# Patient Record
Sex: Female | Born: 1950 | Race: White | Hispanic: No | Marital: Single | Smoking: Never smoker
Health system: Southern US, Community
[De-identification: ages and names within clinical notes are randomized; demographics above are authoritative.]

## PROBLEM LIST (undated history)

## (undated) DIAGNOSIS — I2699 Other pulmonary embolism without acute cor pulmonale: Secondary | ICD-10-CM

## (undated) DIAGNOSIS — I10 Essential (primary) hypertension: Secondary | ICD-10-CM

## (undated) DIAGNOSIS — K589 Irritable bowel syndrome without diarrhea: Secondary | ICD-10-CM

## (undated) HISTORY — PX: APPENDECTOMY: SHX54

---

## 2009-05-10 ENCOUNTER — Ambulatory Visit: Payer: Self-pay | Admitting: Internal Medicine

## 2009-05-13 ENCOUNTER — Inpatient Hospital Stay: Payer: Self-pay | Admitting: *Deleted

## 2009-05-13 ENCOUNTER — Ambulatory Visit: Payer: Self-pay | Admitting: Internal Medicine

## 2009-05-29 ENCOUNTER — Ambulatory Visit: Payer: Self-pay | Admitting: Specialist

## 2009-09-15 ENCOUNTER — Ambulatory Visit: Payer: Self-pay | Admitting: Internal Medicine

## 2009-09-17 ENCOUNTER — Ambulatory Visit: Payer: Self-pay | Admitting: Internal Medicine

## 2010-08-11 IMAGING — XA IR VASCULAR PROCEDURE
5 series · 11 of 11 positions shown · IV contrast (IODINE)
Comparison: none

[Series 1: ivc · 2 of 2 slices shown (1 of 4)]
[im 1/2]
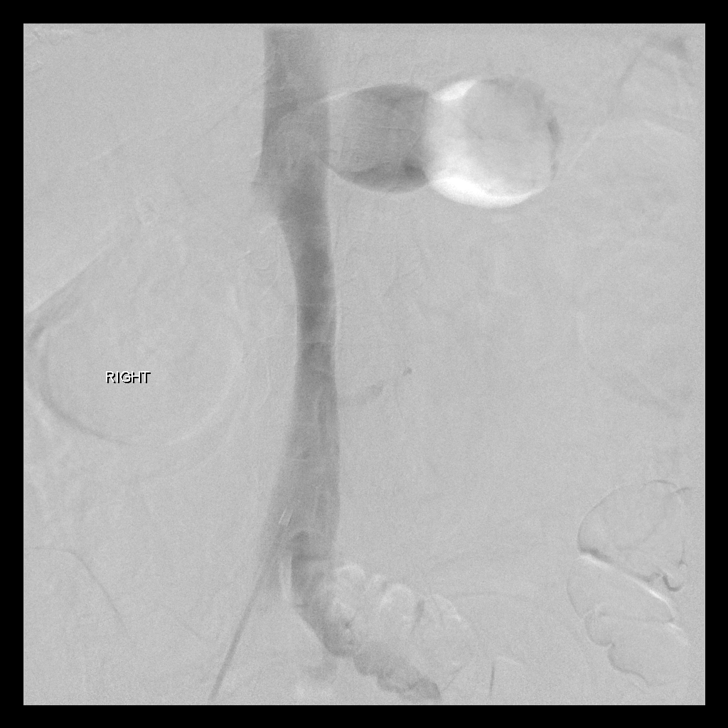
[im 2/2]
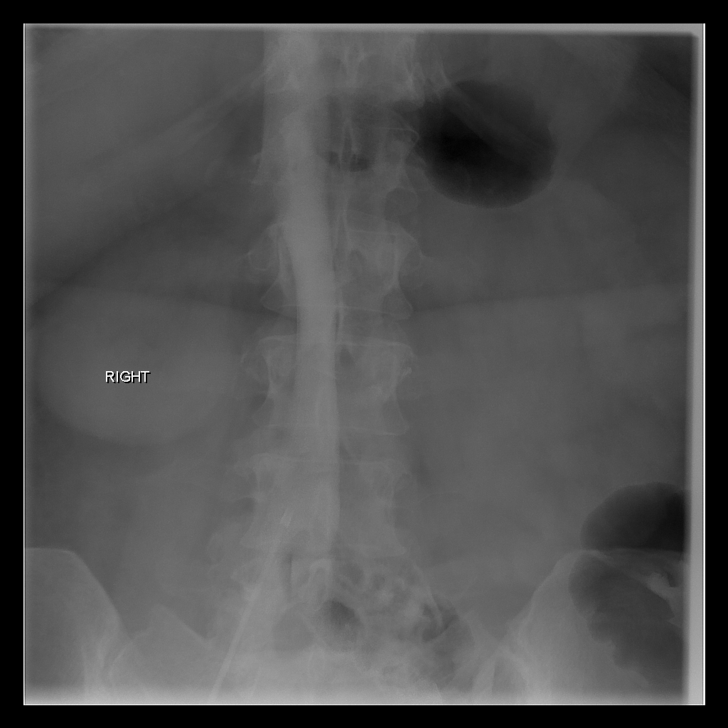

[Series 2: ivc · 3 of 3 slices shown (2 of 4)]
[im 1/3]
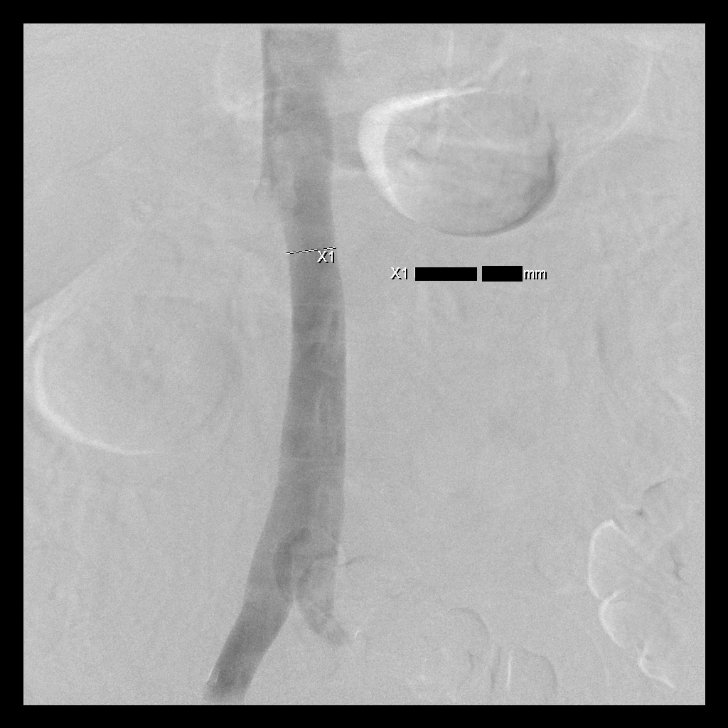
[im 2/3]
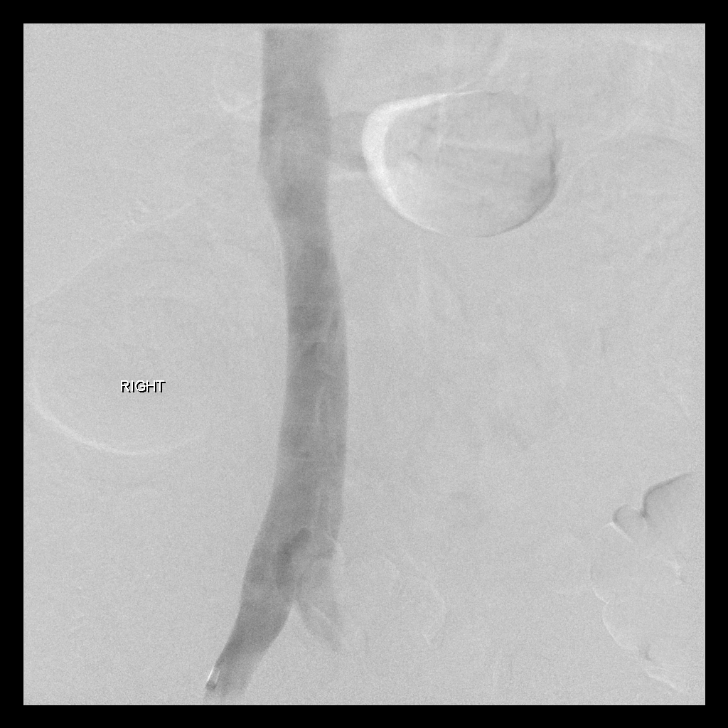
[im 3/3]
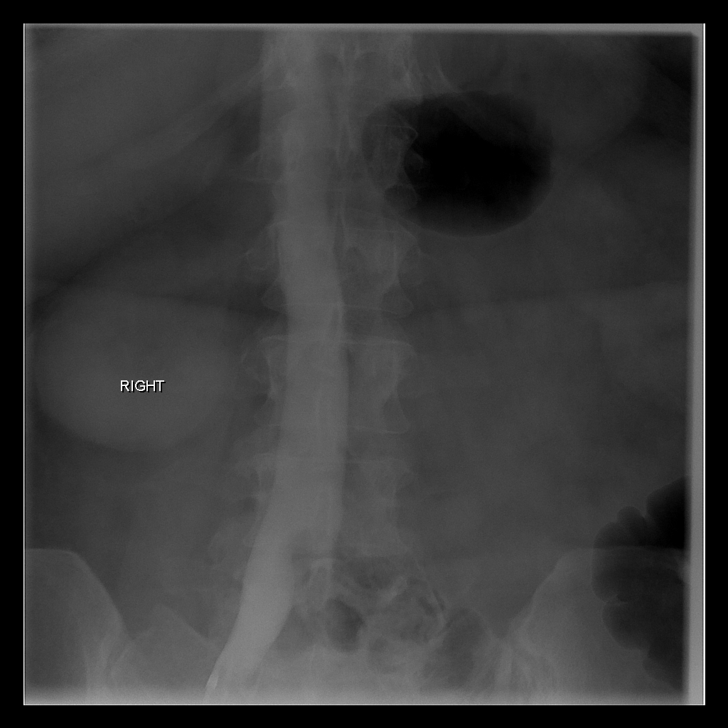

[Series 3: ivc · 3 of 3 slices shown (3 of 4)]
[im 1/3]
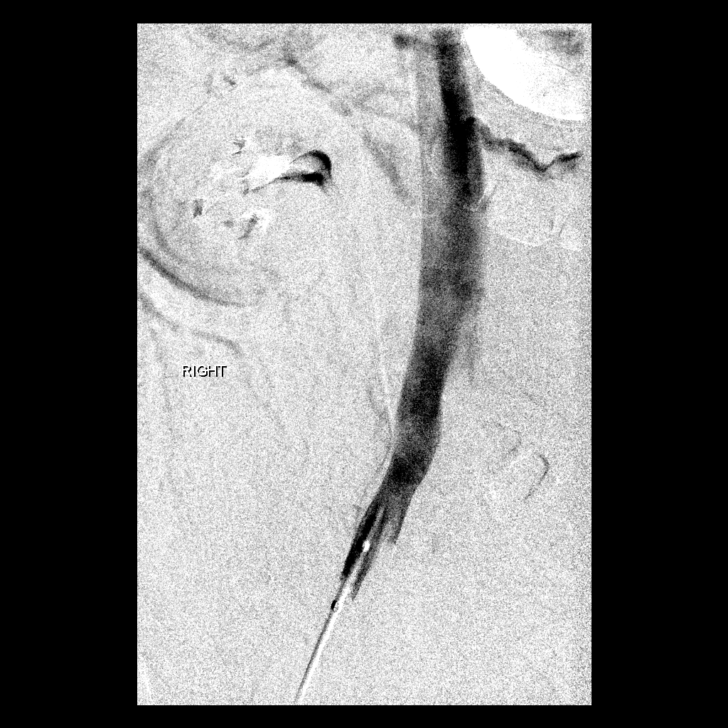
[im 2/3]
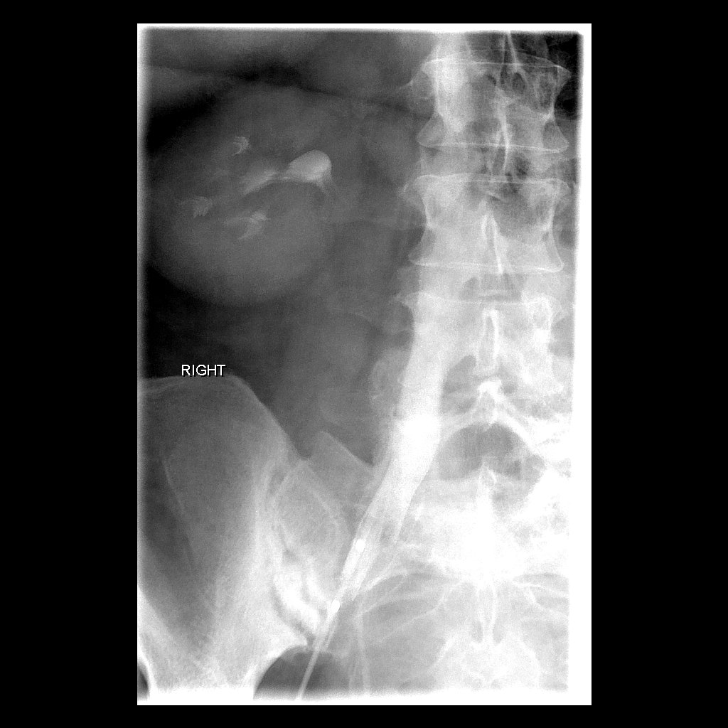
[im 3/3]
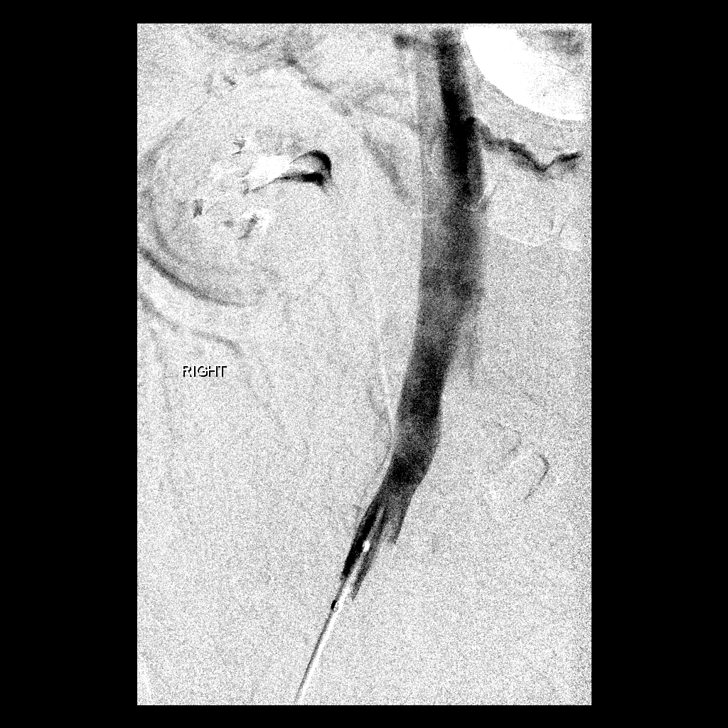

[Series 5: ivc · 2 of 2 slices shown (4 of 4)]
[im 1/2]
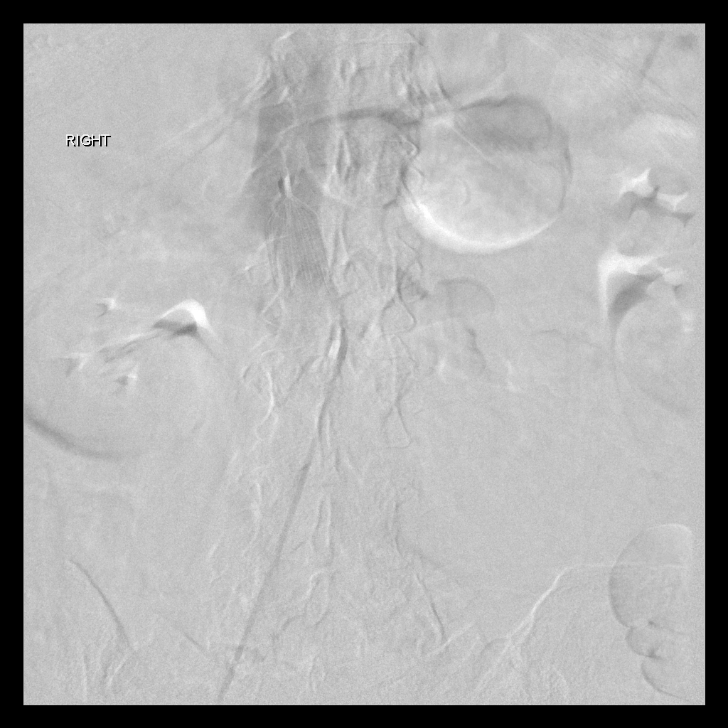
[im 2/2]
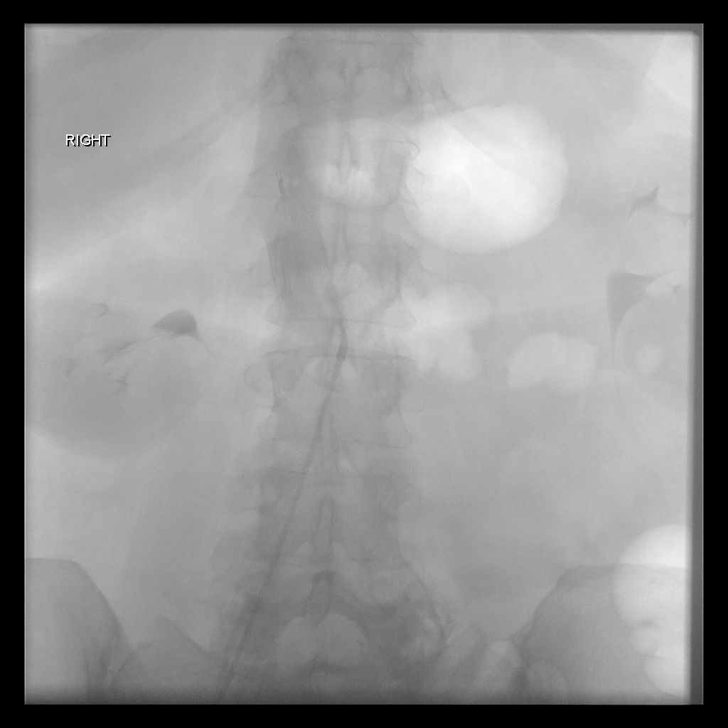

[Series 6: single · 1 of 1 slices shown]
[im 1/1]
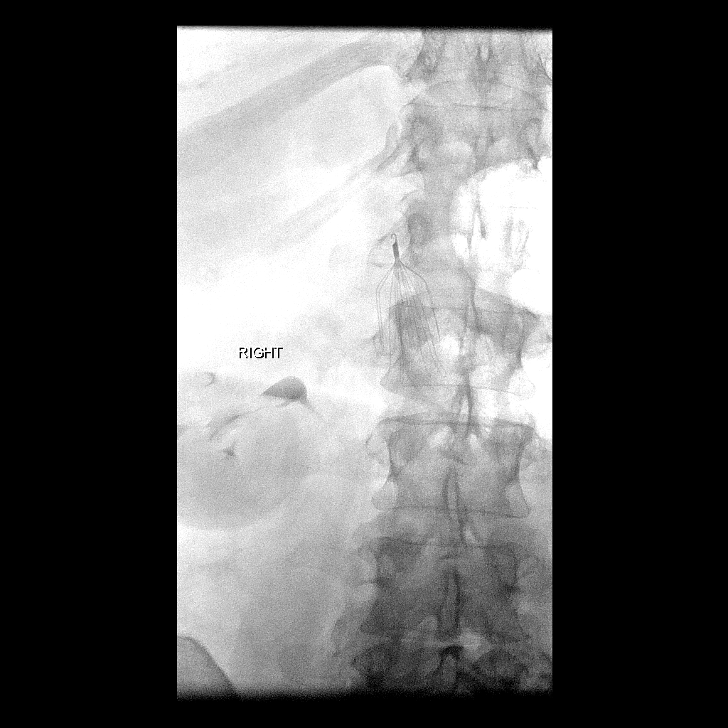

[11 of 11 positions shown; findings below may reference images not displayed]

IMAGES IMPORTED FROM THE SYNGO WORKFLOW SYSTEM
NO DICTATION FOR STUDY

## 2013-06-25 ENCOUNTER — Ambulatory Visit: Payer: Self-pay

## 2013-06-25 LAB — RAPID STREP-A WITH REFLX: Micro Text Report: NEGATIVE

## 2013-06-28 LAB — BETA STREP CULTURE(ARMC)

## 2014-02-02 ENCOUNTER — Ambulatory Visit: Payer: Self-pay | Admitting: Physician Assistant

## 2014-02-02 LAB — RAPID INFLUENZA A&B ANTIGENS (ARMC ONLY)

## 2014-12-09 ENCOUNTER — Ambulatory Visit: Payer: Self-pay | Admitting: Family Medicine

## 2015-09-16 ENCOUNTER — Ambulatory Visit
Admission: EM | Admit: 2015-09-16 | Discharge: 2015-09-16 | Disposition: A | Discharge status: Home / Self Care | Payer: Medicare Other | Attending: Family Medicine | Admitting: Family Medicine

## 2015-09-16 DIAGNOSIS — R1033 Periumbilical pain: Secondary | ICD-10-CM

## 2015-09-16 DIAGNOSIS — R112 Nausea with vomiting, unspecified: Secondary | ICD-10-CM | POA: Diagnosis not present

## 2015-09-16 HISTORY — DX: Irritable bowel syndrome without diarrhea: K58.9

## 2015-09-16 HISTORY — DX: Other pulmonary embolism without acute cor pulmonale: I26.99

## 2015-09-16 HISTORY — DX: Essential (primary) hypertension: I10

## 2015-09-16 MED ORDER — PROMETHAZINE HCL 25 MG/ML IJ SOLN
25.0000 mg | Freq: Once | INTRAMUSCULAR | Status: AC
  Administered 2015-09-16: 25 mg via INTRAMUSCULAR

## 2015-09-16 NOTE — ED Provider Notes (Signed)
CSN: [id number]     Arrival date & time 09/16/15  1039 History   None   Nurses notes were reviewed. Chief Complaint  Patient presents with  . Abdominal Pain  . Emesis   Patient is a 64 year old white female who's had recurrent nausea and abdominal pain since August. She states that since August she's had 3 or 4 episodes which have some nausea down pain but in no past. Her PCP thought this may be secondary to constipation and is placed on MiraLAX which she finally stopped last week It was give her a running stool. She states that the abdominal pain woke her up about 7:00 this morning and she's had no abdominal pain distention of her abdomen and vomiting as well. She stone up 5-6 times according to her sister. She has a history of colonic polyps and gets colonoscoped every 5 years and reports being due for her next colonoscopy. She states that her mother died of metastasis to the brain be thought that originated from the GI tract. She states that she feels that she can only have a good bowel movement she would feel much better.  (Consider location/radiation/quality/duration/timing/severity/associated sxs/prior Treatment) Patient is a 64 y.o. female presenting with abdominal pain and vomiting. The history is provided by the patient and a relative. No language interpreter was used.  Abdominal Pain Pain location:  Generalized Pain quality: bloating, cramping, pressure, sharp and shooting   Pain radiates to:  Does not radiate Pain severity:  Moderate Onset quality:  Sudden Duration:  4 hours Timing:  Constant Progression:  Worsening Chronicity:  New Context: awakening from sleep, laxative use, previous surgery and retching   Context: not medication withdrawal, not sick contacts and not suspicious food intake   Relieved by:  Nothing Ineffective treatments:  None tried Associated symptoms: vomiting   Associated symptoms: no chest pain and no chills   Risk factors: alcohol abuse and obesity     Risk factors: no aspirin use, not elderly, has not had multiple surgeries, no NSAID use and not pregnant   Emesis Associated symptoms: abdominal pain   Associated symptoms: no chills     Past Medical History  Diagnosis Date  . Hypertension   . PE (pulmonary embolism)   . IBS (irritable bowel syndrome)    Past Surgical History  Procedure Laterality Date  . Appendectomy     Family History  Problem Relation Age of Onset  . Cancer Mother   . Pulmonary fibrosis Father    Social History  Substance Use Topics  . Smoking status: Never Smoker   . Smokeless tobacco: None  . Alcohol Use: Yes     Comment: very rarely   OB History    No data available     Review of Systems  Constitutional: Negative for chills.  Cardiovascular: Negative for chest pain.  Gastrointestinal: Positive for vomiting and abdominal pain.  All other systems reviewed and are negative.   Allergies  Codeine  Home Medications   Prior to Admission medications   Medication Sig Start Date End Date Taking? Authorizing Provider  cetirizine (ZYRTEC) 10 MG tablet Take 10 mg by mouth daily.   Yes Historical Provider, MD  fluticasone (FLONASE) 50 MCG/ACT nasal spray Place into both nostrils daily.   Yes Historical Provider, MD  losartan (COZAAR) 50 MG tablet Take 50 mg by mouth daily.   Yes Historical Provider, MD  simethicone (MYLICON) 125 MG chewable tablet Chew 125 mg by mouth every 6 (six) hours as needed for  flatulence.   Yes Historical Provider, MD   Meds Ordered and Administered this Visit   Medications  promethazine (PHENERGAN) injection 25 mg (25 mg Intramuscular Given 09/16/15 1134)    BP 145/56 mmHg  Pulse 56  Temp(Src) 97.2 F (36.2 C) (Tympanic)  Resp 22  Ht  (1.651 m)  Wt 285 lb (129.275 kg)  BMI 47.43 kg/m2  SpO2 97% No data found.   Physical Exam  Constitutional: She is oriented to person, place, and time. She appears well-developed and well-nourished.  HENT:  Head:  Normocephalic and atraumatic.  Right Ear: External ear normal.  Left Ear: External ear normal.  Eyes: Conjunctivae are normal. Pupils are equal, round, and reactive to light.  Neck: Normal range of motion.  Cardiovascular: Normal rate and regular rhythm.   Pulmonary/Chest: Effort normal and breath sounds normal. No respiratory distress. She has no wheezes.  Abdominal: She exhibits distension. Bowel sounds are decreased. There is tenderness in the periumbilical area. There is no CVA tenderness. No hernia.    Musculoskeletal: Normal range of motion. She exhibits no edema.  Neurological: She is alert and oriented to person, place, and time.  Skin: Skin is warm and dry.  Psychiatric: She has a normal mood and affect.  Vitals reviewed.   ED Course  Procedures (including critical care time)  Labs Review Labs Reviewed - No data to display  Imaging Review No results found.   Visual Acuity Review  Right Eye Distance:   Left Eye Distance:   Bilateral Distance:    Right Eye Near:   Left Eye Near:    Bilateral Near:         MDM   1. Periumbilical abdominal pain   2. Non-intractable vomiting with nausea, vomiting of unspecified type    Due to the rigidity of her stomach, decreased bowel sounds, persistent nausea and vomiting and general malaise for the patient probably will benefit from getting fluids lab work and CT of the abdomen which we cannot provide here. Her sister was take her to the closest hospital which would be you see [place] and will give her a 25 mg IM Phenergan to help with the nausea.  Hassan Rowan, MD 09/16/15 [phone]

## 2015-09-16 NOTE — ED Notes (Signed)
Dr. Thurmond ButtsWade examined patient. Pain 10/10 and remains nauseated. [facility] with sister to drive

## 2015-09-16 NOTE — Discharge Instructions (Signed)
Nausea and Vomiting Nausea means you feel sick to your stomach. Throwing up (vomiting) is a reflex where stomach contents come out of your mouth. HOME CARE   Take medicine as told by your doctor.  Do not force yourself to eat. However, you do need to drink fluids.  If you feel like eating, eat a normal diet as told by your doctor.  Eat rice, wheat, potatoes, bread, lean meats, yogurt, fruits, and vegetables.  Avoid high-fat foods.  Drink enough fluids to keep your pee (urine) clear or pale yellow.  Ask your doctor how to replace body fluid losses (rehydrate). Signs of body fluid loss (dehydration) include:  Feeling very thirsty.  Dry lips and mouth.  Feeling dizzy.  Dark pee.  Peeing less than normal.  Feeling confused.  Fast breathing or heart rate. GET HELP RIGHT AWAY IF:   You have blood in your throw up.  You have black or bloody poop (stool).  You have a bad headache or stiff neck.  You feel confused.  You have bad belly (abdominal) pain.  You have chest pain or trouble breathing.  You do not pee at least once every 8 hours.  You have cold, clammy skin.  You keep throwing up after 24 to 48 hours.  You have a fever. MAKE SURE YOU:   Understand these instructions.  Will watch your condition.  Will get help right away if you are not doing well or get worse.   This information is not intended to replace advice given to you by your health care provider. Make sure you discuss any questions you have with your health care provider.   Document Released: 03/28/2008 Document Revised: 01/02/2012 Document Reviewed: 03/11/2011 Elsevier Interactive Patient Education 2016 Elsevier Inc.  Abdominal Pain, Adult Many things can cause belly (abdominal) pain. Most times, the belly pain is not dangerous. Many cases of belly pain can be watched and treated at home. HOME CARE   Do not take medicines that help you go poop (laxatives) unless told to by your  doctor.  Only take medicine as told by your doctor.  Eat or drink as told by your doctor. Your doctor will tell you if you should be on a special diet. GET HELP IF:  You do not know what is causing your belly pain.  You have belly pain while you are sick to your stomach (nauseous) or have runny poop (diarrhea).  You have pain while you pee or poop.  Your belly pain wakes you up at night.  You have belly pain that gets worse or better when you eat.  You have belly pain that gets worse when you eat fatty foods.  You have a fever. GET HELP RIGHT AWAY IF:   The pain does not go away within 2 hours.  You keep throwing up (vomiting).  The pain changes and is only in the right or left part of the belly.  You have bloody or tarry looking poop. MAKE SURE YOU:   Understand these instructions.  Will watch your condition.  Will get help right away if you are not doing well or get worse.   This information is not intended to replace advice given to you by your health care provider. Make sure you discuss any questions you have with your health care provider.   Document Released: 03/28/2008 Document Revised: 10/31/2014 Document Reviewed: 06/19/2013 Elsevier Interactive Patient Education [employer] Elsevier Inc.

## 2015-09-16 NOTE — ED Notes (Signed)
Woke this am with generalized abdominal pain. States abdomen is "very hard and bloated". Started vomiting around 9:30am  Clear emesis. Color pale, skin warm and dry

## 2016-04-15 ENCOUNTER — Emergency Department
Admission: EM | Admit: 2016-04-15 | Discharge: 2016-04-15 | Disposition: A | Discharge status: Home / Self Care | Payer: Medicare Other | Attending: Emergency Medicine | Admitting: Emergency Medicine

## 2016-04-15 ENCOUNTER — Encounter: Payer: Self-pay | Admitting: Emergency Medicine

## 2016-04-15 DIAGNOSIS — Z8679 Personal history of other diseases of the circulatory system: Secondary | ICD-10-CM | POA: Diagnosis not present

## 2016-04-15 DIAGNOSIS — K5641 Fecal impaction: Secondary | ICD-10-CM | POA: Insufficient documentation

## 2016-04-15 DIAGNOSIS — K5900 Constipation, unspecified: Secondary | ICD-10-CM | POA: Diagnosis present

## 2016-04-15 DIAGNOSIS — I10 Essential (primary) hypertension: Secondary | ICD-10-CM | POA: Diagnosis not present

## 2016-04-15 NOTE — ED Provider Notes (Signed)
[facility] Emergency Department Provider Note   ____________________________________________  Time seen: I have reviewed the triage vital signs and the triage nursing note.  HISTORY  Chief Complaint Constipation   Historian Patient  HPI Paige Baker is a 65 y.o. female with a history of irritable bowel syndrome, and gallstones as well as umbilical hernia, who is here for constipation and feeling of rectal impaction. Patient states that she has a colostomy in the past and showed polyps but no other inflammatory bowel disease. She is to have 4 loose bowel movements per day until last September when she started having constipation instead. For the past few days she's had rectal pain and unable to pass stool. Her last vomiting was on Monday. She has been taking Colace, fiber, and then she also tried MiraLAX as well as an enema last night. Pain is moderate.  She actually [facility] and gallbladder removal. She would really like to resolve the fecal impaction 2 get her surgery today rather than reschedule.    Past Medical History  Diagnosis Date  . Hypertension   . PE (pulmonary embolism)   . IBS (irritable bowel syndrome)     There are no active problems to display for this patient.   Past Surgical History  Procedure Laterality Date  . Appendectomy      Current Outpatient Rx  Name  Route  Sig  Dispense  Refill  . cetirizine (ZYRTEC) 10 MG tablet   Oral   Take 10 mg by mouth daily.         . fluticasone (FLONASE) 50 MCG/ACT nasal spray   Each Nare   Place into both nostrils daily.         Marland Kitchen. losartan (COZAAR) 50 MG tablet   Oral   Take 50 mg by mouth daily.         . simethicone (MYLICON) 125 MG chewable tablet   Oral   Chew 125 mg by mouth every 6 (six) hours as needed for flatulence.           Allergies Sulfamethoxazole-trimethoprim and Codeine  Family History   Problem Relation Age of Onset  . Cancer Mother   . Pulmonary fibrosis Father     Social History Social History  Substance Use Topics  . Smoking status: Never Smoker   . Smokeless tobacco: None  . Alcohol Use: Yes     Comment: very rarely    Review of Systems  Constitutional: Negative for fever. Eyes: Negative for visual changes. ENT: Negative for sore throat. Cardiovascular: Negative for chest pain. Respiratory: Negative for shortness of breath. Gastrointestinal: Negative for Vomiting. Genitourinary: Negative for dysuria. Musculoskeletal: Negative for back pain. Skin: Negative for rash. Neurological: Negative for headache. 10 point Review of Systems otherwise negative ____________________________________________   PHYSICAL EXAM:  VITAL SIGNS: ED Triage Vitals  Enc Vitals Group     BP 04/15/16 0626 160/84 mmHg     Pulse Rate 04/15/16 0626 77     Resp 04/15/16 0626 20     Temp 04/15/16 0626 97.8 F (36.6 C)     Temp Source 04/15/16 0626 Oral     SpO2 04/15/16 0626 98 %     Weight 04/15/16 0626 275 lb (124.739 kg)     Height 04/15/16 0626 5\' 5"  (1.651 m)     Head Cir --      Peak Flow --      Pain Score  04/15/16 0625 5     Pain Loc --      Pain Edu? --      Excl. in GC? --      Constitutional: Alert and oriented. Standing up pacing around feels uncomfortable especially when sitting.Marland Kitchen. HEENT   Head: Normocephalic and atraumatic.      Eyes: Conjunctivae are normal. PERRL. Normal extraocular movements.      Ears:         Nose: No congestion/rhinnorhea.   Mouth/Throat: Mucous membranes are moist.   Neck: No stridor. Cardiovascular/Chest: Normal rate, regular rhythm.  No murmurs, rubs, or gallops. Respiratory: Normal respiratory effort without tachypnea nor retractions. Breath sounds are clear and equal bilaterally. No wheezes/rales/rhonchi. Gastrointestinal: Soft. No distention, no guarding, no rebound. Nontender.  Obese  Genitourinary/rectal:  Large  amount of moderately firm stool in the rectum. Musculoskeletal: Nontender with normal range of motion in all extremities. No joint effusions.  No lower extremity tenderness.  No edema. Neurologic:  Normal speech and language. No gross or focal neurologic deficits are appreciated. Skin:  Skin is warm, dry and intact. No rash noted. Psychiatric: Mood and affect are normal. Speech and behavior are normal. Patient exhibits appropriate insight and judgment.  ____________________________________________   EKG I, Governor Rooksebecca Winford Hehn, MD, the attending physician have personally viewed and interpreted all ECGs.  None ____________________________________________  LABS (pertinent positives/negatives)  Labs Reviewed - No data to display  ____________________________________________  RADIOLOGY All Xrays were viewed by me. Imaging interpreted by Radiologist.  None __________________________________________  PROCEDURES  Procedure(s) performed: Manual fecal disimpaction. Performed by myself, Dr. Shaune PollackLord M.D. Moderate amount of hard brown stool was removed manually. No complications.  Critical Care performed: None  ____________________________________________   ED COURSE / ASSESSMENT AND PLAN  Pertinent labs & imaging results that were available during my care of the patient were reviewed by me and considered in my medical decision making (see chart for details).   Patient confirmed to have hard stool in the rectum. I did disimpact her once and then she tried and passed a small amount of stool. I disimpacted her a second time and after that she did have a large bowel movement and felt relieved.  She'll be discharged, she [facility].    CONSULTATIONS:   None   Patient / Family / Caregiver informed of clinical course, medical decision-making process, and agree with plan.   I discussed return precautions, follow-up instructions, and  discharged instructions with patient and/or family.   ___________________________________________   FINAL CLINICAL IMPRESSION(S) / ED DIAGNOSES   Final diagnoses:  Fecal impaction in rectum [facility](HCC)              Note: This dictation was prepared with Dragon dictation. Any transcriptional errors that result from this process are unintentional   Governor Rooksebecca Bevely Hackbart, MD 04/15/16 [id number]

## 2016-04-15 NOTE — ED Notes (Signed)
Pt c/o of constipation beginning 6/19 and nausea. Pt reports she is scheduled for umbilical [facility]. Pt reports she took Miramax on Tuesday and Wednesday, and has been taking 3 colace/day, and gummy fiber vitamins. Pt reports hx of IBS. Pt reports she still has diarrhea once every couple of weeks. Pt reports having intermittent periods of constipation beginning September 2016.

## 2016-04-15 NOTE — Discharge Instructions (Signed)
You were evaluated for constipation and rectal stool impaction which was removed manually.  Return to her for any worsening condition including abdominal pain, black or bloody stools, or any other symptoms concerning to you.  You may try over-the-counter Colace or docusate as a stool softener to help prevent constipation. Also make sure you drink plenty fluids.   Fecal Impaction A fecal impaction happens when there is a large, firm amount of stool (or feces) that cannot be passed. The impacted stool is usually in the rectum, which is the lowest part of the large bowel. The impacted stool can block the colon and cause significant problems. CAUSES  The longer stool stays in the rectum, the harder it gets. Anything that slows down your bowel movements can lead to fecal impaction, such as:  Constipation. This can be a long-standing (chronic) problem or can happen suddenly (acute).  Painful conditions of the rectum, such as hemorrhoids or anal fissures. The pain of these conditions can make you try to avoid having bowel movements.  Narcotic pain-relieving medicines, such as methadone, morphine, or codeine.  Not drinking enough fluids.  Inactivity and bed rest over long periods of time.  Diseases of the brain or nervous system that damage the nerves controlling the muscles of the intestines. SIGNS AND SYMPTOMS   Lack of normal bowel movements or changes in bowel patterns.  Sense of fullness in the rectum but unable to pass stool.  Pain or cramps in the abdominal area (often after meals).  Thin, watery discharge from the rectum. DIAGNOSIS  Your health care provider may suspect that you have a fecal impaction based on your symptoms and a physical exam. This will include an exam of your rectum. Sometimes X-rays or lab testing may be needed to confirm the diagnosis and to be sure there are no other problems.  TREATMENT   Initially an impaction can be removed manually. Using a gloved finger,  your health care provider can remove hard stool from your rectum.  Medicine is sometimes needed. A suppository or enema can be given in the rectum to soften the stool, which can stimulate a bowel movement. Medicines can also be given by mouth (orally).  Though rare, surgery may be needed if the colon has torn (perforated) due to blockage. HOME CARE INSTRUCTIONS   Develop regular bowel habits. This could include getting in the habit of having a bowel movement after your morning cup of coffee or after eating. Be sure to allow yourself enough time on the toilet.  Maintain a high-fiber diet.  Drink enough fluids to keep your urine clear or pale yellow as directed by your health care provider.  Exercise regularly.  If you begin to get constipated, increase the amount of fiber in your diet. Eat plenty of fruits, vegetables, whole wheat breads, bran, oatmeal, and similar products.  Take natural fiber laxatives or other laxatives only as directed by your health care provider. SEEK MEDICAL CARE IF:   You have ongoing rectal pain.  You require enemas or suppositories more than twice a week.  You have rectal bleeding.  You have continued problems, or you develop abdominal pain.  You have thin, pencil-like stools. SEEK IMMEDIATE MEDICAL CARE IF:  You have black or tarry stools. MAKE SURE YOU:   Understand these instructions.  Will watch your condition.  Will get help right away if you are not doing well or get worse.   This information is not intended to replace advice given to you by your  health care provider. Make sure you discuss any questions you have with your health care provider.   Document Released: 07/02/2004 Document Revised: 07/31/2013 Document Reviewed: 04/16/2013 Elsevier Interactive Patient Education [employer] Elsevier Inc.

## 2016-04-15 NOTE — ED Notes (Signed)
Patient states that she has had two moderate bowel movements since intervention by Shaune PollackLord MD. MD notified

## 2016-04-15 NOTE — ED Notes (Signed)
Pt reports she gave herself a fleet enema at 2200 last night with no relief. Pt also reports having a MRI on tuesday

## 2016-04-15 NOTE — ED Notes (Signed)
Patient ambulatory to triage with steady gait, without difficulty or distress noted; pt reports constipated since Monday; denies abd pain; surgery sched this afternoon [facility]; st rectal pain only; fleets and suppository this am without relief

## 2017-03-08 ENCOUNTER — Ambulatory Visit
Admission: EM | Admit: 2017-03-08 | Discharge: 2017-03-08 | Disposition: A | Discharge status: Home / Self Care | Payer: Medicare Other | Attending: Family Medicine | Admitting: Family Medicine

## 2017-03-08 ENCOUNTER — Encounter: Payer: Self-pay | Admitting: *Deleted

## 2017-03-08 DIAGNOSIS — Z4802 Encounter for removal of sutures: Secondary | ICD-10-CM | POA: Diagnosis not present

## 2017-03-08 DIAGNOSIS — L089 Local infection of the skin and subcutaneous tissue, unspecified: Secondary | ICD-10-CM | POA: Diagnosis not present

## 2017-03-08 DIAGNOSIS — T148XXA Other injury of unspecified body region, initial encounter: Secondary | ICD-10-CM

## 2017-03-08 MED ORDER — MUPIROCIN 2 % EX OINT: 1.0000 "application " | TOPICAL_OINTMENT | Freq: Two times a day (BID) | CUTANEOUS | 0 refills | Status: AC

## 2017-03-08 NOTE — ED Provider Notes (Signed)
[facility]    CSN: [id number] Arrival date & time: 03/08/17  1522     History   Chief Complaint Chief Complaint  Patient presents with  . Suture / Staple Removal    HPI Paige Baker is a 66 y.o. female.   Patient was pulled by dog about 10 days ago. She landed on her right arm forearm and right knee. She received abrasions and laceration to the right hand 3 knuckles and right elbow. She states it was fine. She states she was x-rayed at [facility] everything was negative or benign no acute findings were found. She has been was on Keflex for 5 days but has noticed some scabbing and puffiness of the fingers and the elbow. She's been using bacitracin over-the-counter. She denies smoking. She is allergic to codeine. Previous surgery appendectomy she has history hypertension IBS and previous PE. No pertinent family medical history relevant to today's visit   The history is provided by the patient. No language interpreter was used.  Suture / Staple Removal  This is a new problem. The current episode started more than 1 week ago. The problem has not changed since onset.Pertinent negatives include no chest pain, no abdominal pain, no headaches and no shortness of breath. Nothing aggravates the symptoms. Nothing relieves the symptoms. She has tried nothing for the symptoms. The treatment provided no relief.    Past Medical History:  Diagnosis Date  . Hypertension   . IBS (irritable bowel syndrome)   . PE (pulmonary embolism)     There are no active problems to display for this patient.   Past Surgical History:  Procedure Laterality Date  . APPENDECTOMY      OB History    No data available       Home Medications    Prior to Admission medications   Medication Sig Start Date End Date Taking? Authorizing Provider  cetirizine (ZYRTEC) 10 MG tablet Take 10 mg by mouth daily.   Yes [provider]  fluticasone (FLONASE) 50 MCG/ACT nasal spray Place into  both nostrils daily.   Yes [provider]  losartan (COZAAR) 50 MG tablet Take 50 mg by mouth daily.   Yes [provider]  mupirocin ointment (BACTROBAN) 2 % Apply 1 application topically 2 (two) times daily. 03/08/17   Hassan Rowan, MD  simethicone (MYLICON) 125 MG chewable tablet Chew 125 mg by mouth every 6 (six) hours as needed for flatulence.    [provider]    Family History Family History  Problem Relation Age of Onset  . Cancer Mother   . Pulmonary fibrosis Father     Social History Social History  Substance Use Topics  . Smoking status: Never Smoker  . Smokeless tobacco: Never Used  . Alcohol use Yes     Comment: very rarely     Allergies   Sulfamethoxazole-trimethoprim and Codeine   Review of Systems Review of Systems  Respiratory: Negative for shortness of breath.   Cardiovascular: Negative for chest pain.  Gastrointestinal: Negative for abdominal pain.  Skin: Positive for wound.  Neurological: Negative for headaches.  All other systems reviewed and are negative.    Physical Exam Triage Vital Signs ED Triage Vitals  Enc Vitals Group     BP 03/08/17 1541 (!) 158/71     Pulse Rate 03/08/17 1541 68     Resp 03/08/17 1541 16     Temp 03/08/17 1541 97.7 F (36.5 C)     Temp  Source 03/08/17 1541 Oral     SpO2 03/08/17 1541 98 %     Weight --      Height --      Head Circumference --      Peak Flow --      Pain Score 03/08/17 1543 0     Pain Loc --      Pain Edu? --      Excl. in GC? --    No data found.   Updated Vital Signs BP (!) 158/71 (BP Location: Left Arm)   Pulse 68   Temp 97.7 F (36.5 C) (Oral)   Resp 16   SpO2 98%   Visual Acuity Right Eye Distance:   Left Eye Distance:   Bilateral Distance:    Right Eye Near:   Left Eye Near:    Bilateral Near:     Physical Exam  Constitutional: She is oriented to person, place, and time. She appears well-developed and well-nourished.  HENT:  Head:  Normocephalic and atraumatic.  Eyes: Pupils are equal, round, and reactive to light.  Neck: Normal range of motion.  Pulmonary/Chest: Effort normal.  Musculoskeletal: Normal range of motion.  Neurological: She is alert and oriented to person, place, and time.  Skin:  Over the knuckles there is some honey crusted area over the wound and also (the elbow  Psychiatric: She has a normal mood and affect.  Vitals reviewed.    UC Treatments / Results  Labs (all labs ordered are listed, but only abnormal results are displayed) Labs Reviewed - No data to display  EKG  EKG Interpretation None       Radiology No results found.  Procedures Procedures (including critical care time)  Medications Ordered in UC Medications - No data to display   Initial Impression / Assessment and Plan / UC Course  I have reviewed the triage vital signs and the nursing notes.  Pertinent labs & imaging results that were available during my care of the patient were reviewed by me and considered in my medical decision making (see chart for details).     May be early infection still present in the knuckle wound and elbow going to recommend Bactroban ointment apply twice a day for the next 17 days returned not better or see PCP to 3 weeks.  Final Clinical Impressions(s) / UC Diagnoses   Final diagnoses:  Visit for suture removal  Wound infection    New Prescriptions New Prescriptions   MUPIROCIN OINTMENT (BACTROBAN) 2 %    Apply 1 application topically 2 (two) times daily.     Hassan RowanWade, Iverna Hammac, MD 03/08/17 [phone]

## 2017-03-08 NOTE — ED Triage Notes (Signed)
Patient has arrived for suture removal. Wound has healed well with no redness, swelling, or drainage present. 3 sutures removed.
